# Patient Record
Sex: Female | Born: 1985 | Race: White | Hispanic: No | Marital: Single | Smoking: Never smoker
Health system: Southern US, Community
[De-identification: ages and names within clinical notes are randomized; demographics above are authoritative.]

## PROBLEM LIST (undated history)

## (undated) HISTORY — PX: OTHER SURGICAL HISTORY: SHX169

---

## 2004-05-03 ENCOUNTER — Emergency Department (HOSPITAL_COMMUNITY): Admission: EM | Admit: 2004-05-03 | Discharge: 2004-05-03 | Payer: Self-pay | Admitting: Emergency Medicine
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
## 2006-10-12 ENCOUNTER — Encounter: Admission: RE | Admit: 2006-10-12 | Discharge: 2006-10-12 | Payer: Self-pay | Admitting: Rheumatology

## 2006-10-25 ENCOUNTER — Encounter: Admission: RE | Admit: 2006-10-25 | Discharge: 2006-10-25 | Payer: Self-pay | Admitting: Rheumatology

## 2008-05-04 ENCOUNTER — Emergency Department (HOSPITAL_COMMUNITY): Admission: EM | Admit: 2008-05-04 | Discharge: 2008-05-04 | Payer: Self-pay | Admitting: Emergency Medicine
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
## 2012-10-23 ENCOUNTER — Ambulatory Visit (INDEPENDENT_AMBULATORY_CARE_PROVIDER_SITE_OTHER): Payer: BC Managed Care – PPO | Admitting: Internal Medicine

## 2012-10-23 ENCOUNTER — Ambulatory Visit: Payer: BC Managed Care – PPO

## 2012-10-23 VITALS — BP 128/76 | HR 76 | Temp 98.9°F | Resp 18 | Ht 64.5 in | Wt 115.8 lb

## 2012-10-23 DIAGNOSIS — R634 Abnormal weight loss: Secondary | ICD-10-CM

## 2012-10-23 DIAGNOSIS — R109 Unspecified abdominal pain: Secondary | ICD-10-CM

## 2012-10-23 DIAGNOSIS — IMO0001 Reserved for inherently not codable concepts without codable children: Secondary | ICD-10-CM

## 2012-10-23 DIAGNOSIS — R5383 Other fatigue: Secondary | ICD-10-CM

## 2012-10-23 DIAGNOSIS — R197 Diarrhea, unspecified: Secondary | ICD-10-CM

## 2012-10-23 DIAGNOSIS — R61 Generalized hyperhidrosis: Secondary | ICD-10-CM

## 2012-10-23 DIAGNOSIS — R5381 Other malaise: Secondary | ICD-10-CM

## 2012-10-23 DIAGNOSIS — Z719 Counseling, unspecified: Secondary | ICD-10-CM

## 2012-10-23 LAB — POCT CBC
MCH, POC: 31.6 pg — AB (ref 27–31.2)
MID (cbc): 0.4 (ref 0–0.9)
MPV: 7.8 fL (ref 0–99.8)
POC Granulocyte: 4 (ref 2–6.9)
POC MID %: 5.8 %M (ref 0–12)
Platelet Count, POC: 376 10*3/uL (ref 142–424)
RBC: 3.74 M/uL — AB (ref 4.04–5.48)
WBC: 6.5 10*3/uL (ref 4.6–10.2)

## 2012-10-23 LAB — POCT URINALYSIS DIPSTICK
Bilirubin, UA: NEGATIVE
Leukocytes, UA: NEGATIVE
Nitrite, UA: NEGATIVE
Protein, UA: NEGATIVE
pH, UA: 5.5

## 2012-10-23 LAB — POCT UA - MICROSCOPIC ONLY
Crystals, Ur, HPF, POC: NEGATIVE
Yeast, UA: NEGATIVE

## 2012-10-23 MED ORDER — CILIDINIUM-CHLORDIAZEPOXIDE 2.5-5 MG PO CAPS: 1.0000 | ORAL_CAPSULE | Freq: Three times a day (TID) | ORAL | Status: AC | PRN

## 2012-10-23 NOTE — Progress Notes (Signed)
  Subjective:    Patient ID: Ana Thompson, female    DOB: 1985/05/19, 27 y.o.   MRN: [id number]  HPI    Review of Systems     Objective:   Physical Exam        Assessment & Plan:

## 2012-10-23 NOTE — Patient Instructions (Addendum)
Gluten-Free Diet  Gluten is a protein found in many grains. Gluten is present in wheat, rye, and barley. Gluten from wheat, rye, and barley protein interferes with the absorption of food in people with gluten sensitivity. It may also cause intestinal injury when eaten by individuals with gluten sensitivity.   A sample piece (biopsy) of the small intestine is usually required for a positive diagnosis of gluten sensitivity. Dietary treatment consists of eliminating foods and food ingredients from wheat, rye, and barley. When these are taken out of the diet completely, most people regain function of the small intestine.  Strict compliance is important even during symptom-free periods. People with gluten sensitivity need to be on a gluten-free diet for a lifetime. During the first stages of treatment, some people will also need to restrict dairy products that contain lactose, which is a naturally occurring sugar. Lactose is difficult to absorb when the small intestines are damaged (lactose intolerance).   WHO NEEDS THIS DIET  Some people who have certain diseases need to be on a gluten-free diet. These diseases include:  · Celiac disease.  · Nontropical sprue.  · Gluten-sensitive enteropathy.  · Dermatitis herpetiformis.  SPECIAL NOTES  · Read all labels because gluten may have been added as an incidental ingredient. Words to check for on the label include: flour, starch, durum flour, graham flour, phosphated flour, self-rising flour, semolina, farina, modified food starch, cereal, thickening, fillers, emulsifiers, any kind of malt flavoring, and hydrolyzed vegetable protein. A registered dietician can help you identify possible harmful ingredients in the foods you normally eat.  · If you are not sure whether an ingredient contains gluten, check with the manufacturer. Note that some manufacturers may change ingredients without notice. Always read labels.   · Since flour and cereal products are often used in the  preparation of foods, it is important to be aware of the methods of preparation used, as well as the foods themselves. This is especially true when you are dining out.  Starches  · Allowed: Only those prepared from arrowroot, corn, potato, rice, and bean flours. Rice wafers(*), pure cornmeal tortillas, popcorn, some crackers, and chips(*). Hot cereals made from cornmeal. Ask your dietician which specific hot and cold cereals are allowed. White or sweet potatoes, yams, hominy, rice or wild rice, and special gluten-free pasta. Some oriental rice noodles or bean noodles.  · Avoid: All wheat and rye cereals, wheat germ, barley, bran, graham, malt, bulgur, and millet(-). NOTE: Avoid cereals containing malt as a flavoring, such as rice cereal. Regular noodles, spaghetti, macaroni, and most packaged rice mixes(*). All others containing wheat, rye, or barley.  Vegetables  · Allowed: All plain, fresh, frozen, or canned vegetables.  · Avoid: Creamed vegetables(*) and vegetables canned in sauces(*). Any prepared with wheat, rye, or barley.  Fruit  · Allowed: All fresh, frozen, canned, or dried fruits. Fruit juices.  · Avoid: Thickened or prepared fruits and some pie fillings(*).  Meat and Meat Substitutes  · Allowed: Meat, fish, poultry, or eggs prepared without added wheat, rye, or barley. Luncheon meat(*), frankfurters(*), and pure meat. All aged cheese and processed cheese products(*). Cottage cheese(+) and cream cheese(+). Dried beans, dried peas, and lentils.  · Avoid: Any meat or meat alternate containing wheat, rye, barley, or gluten stabilizers. Bread-containing products, such as Swiss steak, croquettes, and meatloaf. Tuna canned in vegetable broth(*); turkey with HVP injected as part of the basting; any cheese product containing oat gum as an ingredient.  Milk  · Allowed: Milk.   Yogurt made with allowed ingredients(*).  · Avoid: Commercial chocolate milk which may have cereal added(*). Malted milk.  Soups and  Combination Foods  · Allowed: Homemade broth and soups made with allowed ingredients; some canned or frozen soups are allowed(*). Combination or prepared foods that do not contain gluten(*). Read labels.  · Avoid: All soups containing wheat, rye, or barley flour. Bouillon and bouillon cubes that contain hydrolyzed vegetable protein (HVP). Combination or prepared foods that contain gluten(*).  Desserts  · Allowed:  Custard, some pudding mixes(*), homemade puddings from cornstarch, rice, and tapioca. Gelatin desserts, ices, and sherbet(*). Cake, cookies, and other desserts prepared with allowed flours. Some commercial ice creams(*). Ask your dietician about specific brands of dessert that are allowed.  · Avoid: Cakes, cookies, doughnuts, and pastries that are prepared with wheat, rye, or barley flour. Some commercial ice creams(*), ice cream flavors which contain cookies, crumbs, or cheesecake(*). Ice cream cones. All commercially prepared mixes for cakes, cookies, and other desserts(*). Bread pudding and other puddings thickened with flour.  Sweets  · Allowed: Sugar, honey, syrup(*), molasses, jelly, jam, plain hard candy, marshmallows, gumdrops, homemade candies free from wheat, rye, or barley. Coconut.  · Avoid: Commercial candies containing wheat, rye, or barley(*). Certain buttercrunch toffees are dusted with wheat flour. Ask your dietician about specific brands that are not allowed. Chocolate-coated nuts, which are often rolled in flour.  Fats and Oils  · Allowed: Butter, margarine, vegetable oil, sour cream(+), whipping cream, shortening, lard, cream, mayonnaise(*). Some commercial salad dressings(*). Peanut butter.  · Avoid: Some commercial salad dressings(*).  Beverages  · Allowed: Coffee (regular or decaffeinated), tea, herbal tea (read label to be sure that no wheat flour has been added). Carbonated beverages and some root beers(*). Wine, sake, and distilled spirits, such as gin, vodka, and  whiskey.  · Avoid:  Certain cereal beverages. Ask your dietician about specific brands that are not allowed. Beer (unless gluten-free), ale, malted milk, and some root beers, wine, and sake.  Condiments/ Miscellaneous  · Allowed: Salt, pepper, herbs, spices, extracts, and food colorings. Monosodium glutamate (MSG). Cider, rice, and wine vinegar. Baking soda and baking powder. Certain soy sauces. Ask your dietician about specific brands that are allowed. Nuts, coconut, chocolate, and pure cocoa powder.  · Avoid: Some curry powder(*), some dry seasoning mixes(*), some gravy extracts(*), some meat sauces(*), some catsup(*), some prepared mustard(*), horseradish(*), some soy sauce(*), chip dips(*), and some chewing gum(*). Yeast extract (contains barley). Caramel color (may contain malt). Ask your dietician about specific brands of condiments to avoid.  Flour and Thickening Agents  · Allowed: Arrowroot starch (A); Corn bran (B); Corn flour (B,C,D); Corn germ (B); Cornmeal (B,C,D); Corn starch (A); Potato flour (B,C,E); Potato starch flour (B,C,E); Rice bran (B); Rice flours: Plain, brown (B,C,D,E), and Sweet (A,B,C,F). Rice polish (B,C,G); Soy flour (B,C,G); Tapioca starch (A).  The flour and thickening agents described above are good for:  (A) Good thickening agent  (B) Good when combined with other flours  (C) Best combined with milk and eggs in baked products  (D) Best in grainy-textured products  (E) Produces drier product than other flours  (F) Produces moister product than other flours  (G) Adds distinct flavor to product. Use in moderation.  (*) Check labels and investigate any questionable ingredients.   (-) Additional research is needed before this product can be recommended.  (+) Check vegetable gum used.  SAMPLE MEAL PLAN  Breakfast   · Orange juice.  · Banana.  ·   Gluten-free bread.  Custard.  Heart-healthy margarine.  Coffee or tea. These meal plans are provided as samples. Your daily meal plans will vary. Document Released: 04/04/2005 Document Revised: 10/04/2011 Document Reviewed: 05/15/2011 [facility] Patient Information 2014 [place], [place]. Irritable Bowel Syndrome Irritable Bowel Syndrome (IBS) is caused by a disturbance of normal bowel function. Other terms used are spastic colon, mucous colitis, and irritable colon. It does not require surgery, nor does it lead to cancer. There is no cure for IBS. But with proper diet, stress reduction, and medication, you will find that your problems (symptoms) will gradually disappear or improve. IBS is a common digestive disorder. It usually appears in late adolescence or early adulthood. Women develop it twice as often as men. CAUSES  After food has been digested and absorbed in the small intestine, waste material is moved into the colon (large intestine). In the colon, water and salts are absorbed from the undigested products coming from the small intestine. The remaining residue, or fecal material, is held for elimination. Under normal circumstances, gentle, rhythmic contractions on the bowel walls push the fecal material along the colon towards the rectum. In IBS, however, these contractions are irregular and poorly coordinated. The fecal material is either retained too long, resulting in constipation, or expelled too soon, producing diarrhea. SYMPTOMS  The most common symptom of IBS is pain. It is typically in the lower left side of the belly (abdomen). But it may occur anywhere in the abdomen. It can be felt as heartburn, backache, or even as a dull pain in the arms or  shoulders. The pain comes from excessive bowel-muscle spasms and from the buildup of gas and fecal material in the colon. This pain:  Can range from sharp belly (abdominal) cramps to a dull, continuous ache.  Usually worsens soon after eating.  Is typically relieved by having a bowel movement or passing gas. Abdominal pain is usually accompanied by constipation. But it may also produce diarrhea. The diarrhea typically occurs right after a meal or upon arising in the morning. The stools are typically soft and watery. They are often flecked with secretions (mucus). Other symptoms of IBS include:  Bloating.  Loss of appetite.  Heartburn.  Feeling sick to your stomach (nausea).  Belching  Vomiting  Gas. IBS may also cause a number of symptoms that are unrelated to the digestive system:  Fatigue.  Headaches.  Anxiety  Shortness of breath  Difficulty in concentrating.  Dizziness. These symptoms tend to come and go. DIAGNOSIS  The symptoms of IBS closely mimic the symptoms of other, more serious digestive disorders. So your caregiver may wish to perform a variety of additional tests to exclude these disorders. He/she wants to be certain of learning what is wrong (diagnosis). The nature and purpose of each test will be explained to you. TREATMENT A number of medications are available to help correct bowel function and/or relieve bowel spasms and abdominal pain. Among the drugs available are:  Mild, non-irritating laxatives for severe constipation and to help restore normal bowel habits.  Specific anti-diarrheal medications to treat severe or prolonged diarrhea.  Anti-spasmodic agents to relieve intestinal cramps.  Your caregiver may also decide to treat you with a mild tranquilizer or sedative during unusually stressful periods in your life. The important thing to remember is that if any drug is prescribed for you, make sure that you take it exactly as directed. Make sure  that your caregiver knows how well it worked for you. HOME CARE INSTRUCTIONS  Avoid foods that are high in fat or oils. Some examples [id number] cream, butter, frankfurters, sausage, and other fatty meats.  Avoid foods that have a laxative effect, such as fruit, fruit juice, and dairy products.  Cut out carbonated drinks, chewing gum, and "gassy" foods, such as beans and cabbage. This may help relieve bloating and belching.  Bran taken with plenty of liquids may help relieve constipation.  Keep track of what foods seem to trigger your symptoms.  Avoid emotionally charged situations or circumstances that produce anxiety.  Start or continue exercising.  Get plenty of rest and sleep. MAKE SURE YOU:   Understand these instructions.  Will watch your condition.  Will get help right away if you are not doing well or get worse. Document Released: 04/04/2005 Document Revised: 06/27/2011 Document Reviewed: 11/23/2007 [facility] Patient Information 2014 [place], [place]. Lactose Intolerance, Adult Lactose intolerance is when the body is not able to digest lactose, a sugar found in milk and milk products. Lactose intolerance is caused by your body not producing enough of the enzyme lactase. When there is not enough lactase to digest the amount of lactose consumed, discomfort may be felt. Lactose intolerance is not a milk allergy. For most people, lactase deficiency is a condition that develops naturally over time. After about the age of 2, the body begins to produce less lactase. But many people may not experience symptoms until they are much older. CAUSES Things that can cause you to be lactose intolerant include:  Aging.  Being born without the ability to make lactase.  Certain digestive diseases.  Injuries to the small intestine. SYMPTOMS   Feeling sick to your stomach (nauseous).  Diarrhea.  Cramps.  Bloating.  Gas. Symptoms usually show up a half hour or 2 hours after eating  or drinking products containing lactose. TREATMENT  No treatment can improve the body's ability to produce lactase. However, symptoms can be controlled through diet. A medicine may be given to you to take when you consume lactose-containing foods or drinks. The medicine contains the lactase enzyme, which help the body digest lactose better. HOME CARE INSTRUCTIONS  Eat or drink dairy products as told by your caregiver or dietician.  Take all medicine as directed by your caregiver.  Find lactose-free or lactose-reduced products at your local grocery store.  Talk to your caregiver or dietician to decide if you need any dietary supplements. The following is the amount of calcium needed from the diet:  19 to 50 years: 1000 mg  Over 50 years: 1200 mg Calcium and Lactose in Common Foods Non-Dairy Products / Calcium Content (mg)  Calcium-fortified orange juice, 1 cup / 308 to 344 mg  Sardines, with edible bones, 3 oz / 270 mg  Salmon, canned, with edible bones, 3 oz / 205 mg  Soymilk, fortified, 1 cup / 200 mg  Broccoli (raw), 1 cup / 90 mg  Orange, 1 medium / 50 mg  Pinto beans,  cup / 40 mg  Tuna, canned, 3 oz / 10 mg  Lettuce greens,  cup / 10 mg Dairy Products / Calcium Content (mg) / Lactose Content (g)  Yogurt, plain, low-fat, 1 cup / 415 mg / 5 g  Milk, reduced fat, 1 cup / 295 mg / 11 g  Swiss cheese, 1 oz / 270 mg / 1 g  Ice cream,  cup / 85 mg / 6 g  Cottage cheese,  cup / 75 mg / 2 to 3 g SEEK MEDICAL CARE IF: You have  no relief from your symptoms. Document Released: 04/04/2005 Document Revised: 06/27/2011 Document Reviewed: 07/02/2010 [facility] Patient Information 2014 [place], [place]. Constipation, Adult Constipation is when a person has fewer than 3 bowel movements a week; has difficulty having a bowel movement; or has stools that are dry, hard, or larger than normal. As people grow older, constipation is more common. If you try to fix constipation with  medicines that make you have a bowel movement (laxatives), the problem may get worse. Long-term laxative use may cause the muscles of the colon to become weak. A low-fiber diet, not taking in enough fluids, and taking certain medicines may make constipation worse. CAUSES   Certain medicines, such as antidepressants, pain medicine, iron supplements, antacids, and water pills.   Certain diseases, such as diabetes, irritable bowel syndrome (IBS), thyroid disease, or depression.   Not drinking enough water.   Not eating enough fiber-rich foods.   Stress or travel.  Lack of physical activity or exercise.  Not going to the restroom when there is the urge to have a bowel movement.  Ignoring the urge to have a bowel movement.  Using laxatives too much. SYMPTOMS   Having fewer than 3 bowel movements a week.   Straining to have a bowel movement.   Having hard, dry, or larger than normal stools.   Feeling full or bloated.   Pain in the lower abdomen.  Not feeling relief after having a bowel movement. DIAGNOSIS  Your caregiver will take a medical history and perform a physical exam. Further testing may be done for severe constipation. Some tests may include:   A barium enema X-ray to examine your rectum, colon, and sometimes, your small intestine.  A sigmoidoscopy to examine your lower colon.  A colonoscopy to examine your entire colon. TREATMENT  Treatment will depend on the severity of your constipation and what is causing it. Some dietary treatments include drinking more fluids and eating more fiber-rich foods. Lifestyle treatments may include regular exercise. If these diet and lifestyle recommendations do not help, your caregiver may recommend taking over-the-counter laxative medicines to help you have bowel movements. Prescription medicines may be prescribed if over-the-counter medicines do not work.  HOME CARE INSTRUCTIONS   Increase dietary fiber in your diet, such as  fruits, vegetables, whole grains, and beans. Limit high-fat and processed sugars in your diet, such as [place] fries, hamburgers, cookies, candies, and soda.   A fiber supplement may be added to your diet if you cannot get enough fiber from foods.   Drink enough fluids to keep your urine clear or pale yellow.   Exercise regularly or as directed by your caregiver.   Go to the restroom when you have the urge to go. Do not hold it.  Only take medicines as directed by your caregiver. Do not take other medicines for constipation without talking to your caregiver first. SEEK IMMEDIATE MEDICAL CARE IF:   You have bright red blood in your stool.   Your constipation lasts for more than 4 days or gets worse.   You have abdominal or rectal pain.   You have thin, pencil-like stools.  You have unexplained weight loss. MAKE SURE YOU:   Understand these instructions.  Will watch your condition.  Will get help right away if you are not doing well or get worse. Document Released: 01/01/2004 Document Revised: 06/27/2011 Document Reviewed: 03/08/2011 [facility] Patient Information 2014 [place], [place]. Diarrhea Diarrhea is frequent loose and watery bowel movements. It can cause you to feel  weak and dehydrated. Dehydration can cause you to become tired and thirsty, have a dry mouth, and have decreased urination that often is dark yellow. Diarrhea is a sign of another problem, most often an infection that will not last long. In most cases, diarrhea typically lasts 2 3 days. However, it can last longer if it is a sign of something more serious. It is important to treat your diarrhea as directed by your caregive to lessen or prevent future episodes of diarrhea. CAUSES  Some common causes include:  Gastrointestinal infections caused by viruses, bacteria, or parasites.  Food poisoning or food allergies.  Certain medicines, such as antibiotics, chemotherapy, and laxatives.  Artificial sweeteners  and fructose.  Digestive disorders. HOME CARE INSTRUCTIONS  Ensure adequate fluid intake (hydration): have 1 cup (8 oz) of fluid for each diarrhea episode. Avoid fluids that contain simple sugars or sports drinks, fruit juices, whole milk products, and sodas. Your urine should be clear or pale yellow if you are drinking enough fluids. Hydrate with an oral rehydration solution that you can purchase at pharmacies, retail stores, and online. You can prepare an oral rehydration solution at home by mixing the following ingredients together:    tsp table salt.   tsp baking soda.   tsp salt substitute containing potassium chloride.  1  tablespoons sugar.  1 L (34 oz) of water.  Certain foods and beverages may increase the speed at which food moves through the gastrointestinal (GI) tract. These foods and beverages should be avoided and include:  Caffeinated and alcoholic beverages.  High-fiber foods, such as raw fruits and vegetables, nuts, seeds, and whole grain breads and cereals.  Foods and beverages sweetened with sugar alcohols, such as xylitol, sorbitol, and mannitol.  Some foods may be well tolerated and may help thicken stool including:  Starchy foods, such as rice, toast, pasta, low-sugar cereal, oatmeal, grits, baked potatoes, crackers, and bagels.  Bananas.  Applesauce.  Add probiotic-rich foods to help increase healthy bacteria in the GI tract, such as yogurt and fermented milk products.  Wash your hands well after each diarrhea episode.  Only take over-the-counter or prescription medicines as directed by your caregiver.  Take a warm bath to relieve any burning or pain from frequent diarrhea episodes. SEEK IMMEDIATE MEDICAL CARE IF:   You are unable to keep fluids down.  You have persistent vomiting.  You have blood in your stool, or your stools are black and tarry.  You do not urinate in 6 8 hours, or there is only a small amount of very dark urine.  You have  abdominal pain that increases or localizes.  You have weakness, dizziness, confusion, or lightheadedness.  You have a severe headache.  Your diarrhea gets worse or does not get better.  You have a fever or persistent symptoms for more than 2 3 days.  You have a fever and your symptoms suddenly get worse. MAKE SURE YOU:   Understand these instructions.  Will watch your condition.  Will get help right away if you are not doing well or get worse. Document Released: 03/25/2002 Document Revised: 03/21/2012 Document Reviewed: 12/11/2011 [facility] Patient Information 2014 [place], [place].

## 2012-10-23 NOTE — Progress Notes (Signed)
Subjective:    Patient ID: Ana Thompson, female    DOB: 1985-05-22, 27 y.o.   MRN: [id number]  HPI Three weeks ago pt experienced constipation and took Senna and Miralax which was effective but caused diarrhea. Since this time pt has continued to have diarrhea and gas causing discomfort and stomach to rumble. Some burping and some flatulence but mostly feels that the gas is trapped. Pt also experiencing nausea and chills/cold sweats when she has cramping w/diarrhea or stomach pains in between episodes of diarrhea. No vomiting. No burning. No hx of stomach problems. Family hx of spastic colon and IBS (mother). Pt still has appetite, but unable to eat normally d/t increased gas and stomach pain w/eating. Pt has been experiencing worsened sharp stomach pains today. Pt reports HAs during the same time period. Pt experiencing some weakness, esp after diarrhea. Pt has not been drinking enough d/t liquids also causing gas. No pain or problems w/urination. No back pain. Reg menstrual period. No chance of pregnancy. No trouble breathing or cough. 10 lbs of unintentional weight loss over last 6 mos. Good appetite. Under a lot of stress.   Review of Systems fhx ibs    Objective:   Physical Exam  Vitals reviewed. Constitutional: She is oriented to person, place, and time. She appears well-developed and well-nourished. No distress.  HENT:  Mouth/Throat: Oropharynx is clear and moist.  Eyes: EOM are normal. No scleral icterus.  Neck: Normal range of motion. Neck supple. No thyromegaly present.  Cardiovascular: Normal rate, regular rhythm, normal heart sounds and intact distal pulses.   Abdominal: Soft. Bowel sounds are normal. She exhibits no distension and no mass. There is no rebound and no guarding.  Musculoskeletal: Normal range of motion.  Neurological: She is alert and oriented to person, place, and time. No cranial nerve deficit. She exhibits normal muscle tone. Coordination normal.  Skin: No rash  noted.  Psychiatric: She has a normal mood and affect. Her behavior is normal. Judgment and thought content normal.   [facility] reading (PRIMARY) by  Dr Perrin Maltese normal AAS Results for orders placed in visit on 10/23/12  POCT CBC      Result Value Range   WBC 6.5  4.6 - 10.2 K/uL   Lymph, poc 2.1  0.6 - 3.4   POC LYMPH PERCENT 32.9  10 - 50 %L   MID (cbc) 0.4  0 - 0.9   POC MID % 5.8  0 - 12 %M   POC Granulocyte 4.0  2 - 6.9   Granulocyte percent 61.3  37 - 80 %G   RBC 3.74 (*) 4.04 - 5.48 M/uL   Hemoglobin 11.8 (*) 12.2 - 16.2 g/dL   HCT, POC 78.2 (*) 95.6 - 47.9 %   MCV 99.0 (*) 80 - 97 fL   MCH, POC 31.6 (*) 27 - 31.2 pg   MCHC 31.9  31.8 - 35.4 g/dL   RDW, POC 21.3     Platelet Count, POC 376  142 - 424 K/uL   MPV 7.8  0 - 99.8 fL  POCT URINE PREGNANCY      Result Value Range   Preg Test, Ur Negative    POCT UA - MICROSCOPIC ONLY      Result Value Range   WBC, Ur, HPF, POC 0-1     RBC, urine, microscopic 0-1     Bacteria, U Microscopic trace     Mucus, UA neg     Epithelial cells, urine per micros 0-2  Crystals, Ur, HPF, POC neg     Casts, Ur, LPF, POC neg     Yeast, UA neg    POCT URINALYSIS DIPSTICK      Result Value Range   Color, UA yellow     Clarity, UA clear     Glucose, UA neg     Bilirubin, UA neg     Ketones, UA neg     Spec Grav, UA 1.015     Blood, UA neg     pH, UA 5.5     Protein, UA neg     Urobilinogen, UA 0.2     Nitrite, UA neg     Leukocytes, UA Negative             Assessment & Plan:  Weight loss/Abdominal pain/Possible constipation Stress/Trial librax and gluten free, lactose free diet F/up 2-4 weeks

## 2012-10-24 LAB — COMPREHENSIVE METABOLIC PANEL
ALT: 16 U/L (ref 0–35)
AST: 19 U/L (ref 0–37)
Creat: 0.71 mg/dL (ref 0.50–1.10)
Total Bilirubin: 0.4 mg/dL (ref 0.3–1.2)

## 2012-10-26 ENCOUNTER — Encounter: Payer: Self-pay | Admitting: Radiology

## 2012-11-20 ENCOUNTER — Other Ambulatory Visit: Payer: Self-pay | Admitting: Gastroenterology

## 2012-11-20 DIAGNOSIS — R634 Abnormal weight loss: Secondary | ICD-10-CM

## 2012-11-20 DIAGNOSIS — R109 Unspecified abdominal pain: Secondary | ICD-10-CM

## 2012-11-21 ENCOUNTER — Ambulatory Visit
Admission: RE | Admit: 2012-11-21 | Discharge: 2012-11-21 | Disposition: A | Discharge status: Home / Self Care | Payer: BC Managed Care – PPO | Source: Ambulatory Visit | Attending: Gastroenterology | Admitting: Gastroenterology

## 2012-11-21 DIAGNOSIS — R634 Abnormal weight loss: Secondary | ICD-10-CM

## 2012-11-21 DIAGNOSIS — R109 Unspecified abdominal pain: Secondary | ICD-10-CM

## 2013-09-06 ENCOUNTER — Other Ambulatory Visit: Payer: Self-pay | Admitting: Orthopedic Surgery

## 2013-09-06 DIAGNOSIS — M461 Sacroiliitis, not elsewhere classified: Secondary | ICD-10-CM

## 2013-09-14 ENCOUNTER — Other Ambulatory Visit: Payer: BC Managed Care – PPO

## 2014-08-06 ENCOUNTER — Other Ambulatory Visit: Payer: Self-pay | Admitting: Family Medicine

## 2014-08-06 DIAGNOSIS — E042 Nontoxic multinodular goiter: Secondary | ICD-10-CM

## 2014-08-14 ENCOUNTER — Other Ambulatory Visit: Payer: Self-pay

## 2014-08-28 IMAGING — US US ABDOMEN COMPLETE
1 series · 14 of 25 positions shown · non-contrast
Comparison: None.

CLINICAL DATA: Abdominal pain and weight loss.

COMPLETE ABDOMINAL ULTRASOUND

[Series 1: us abdomen complete · 0.20mm/px · 14 of 87 slices shown]
[im 1/87]
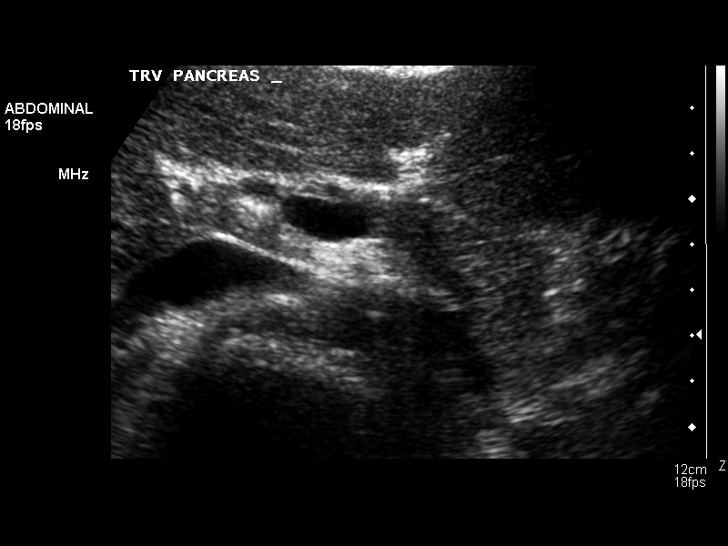
[im 8/87]
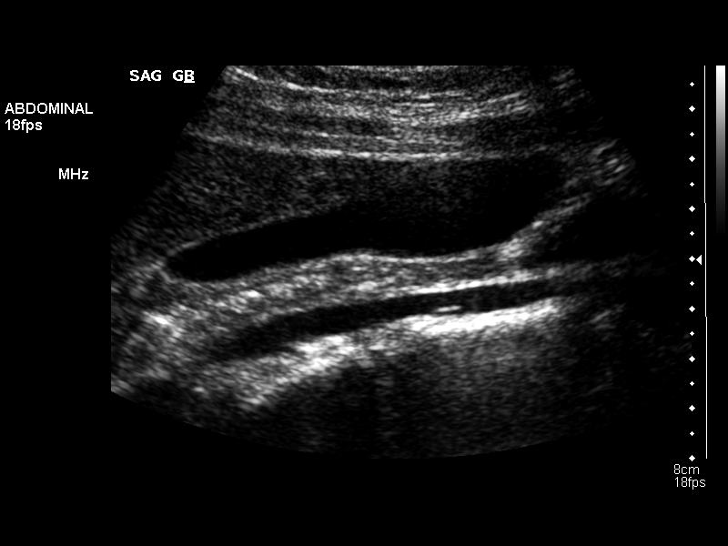
[im 15/87]
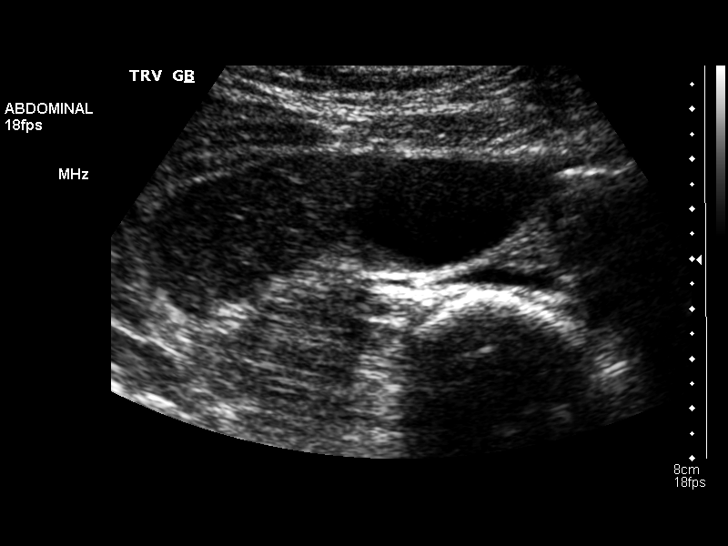
[im 22/87]
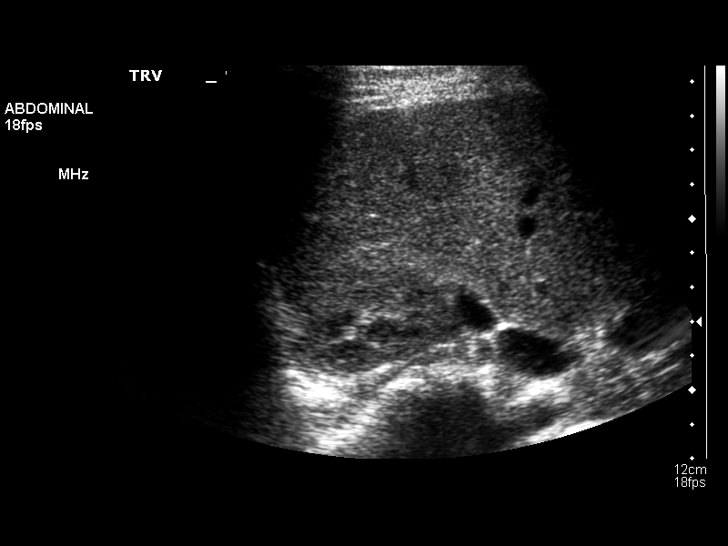
[im 29/87]
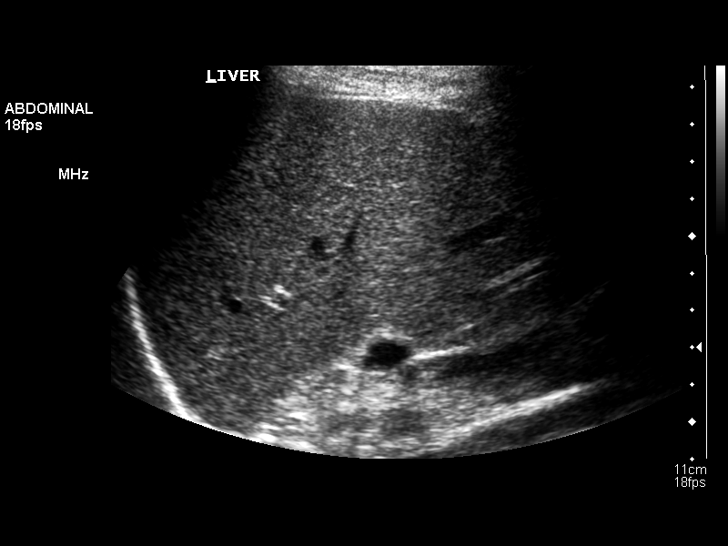
[im 33/87]
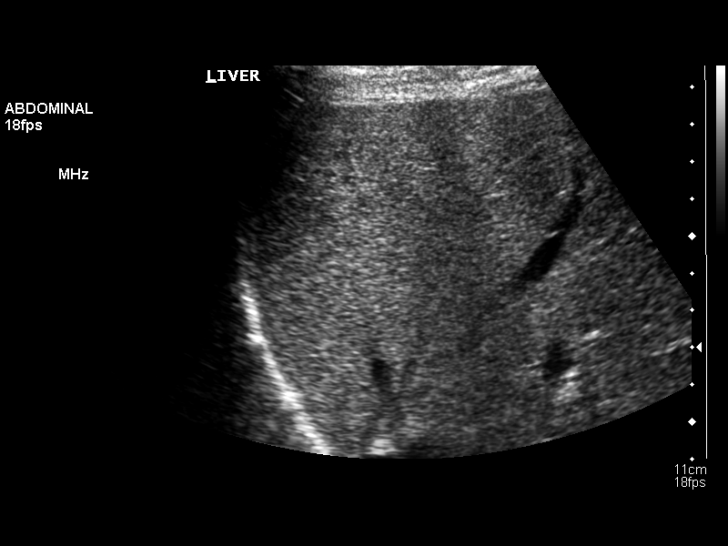
[im 40/87]
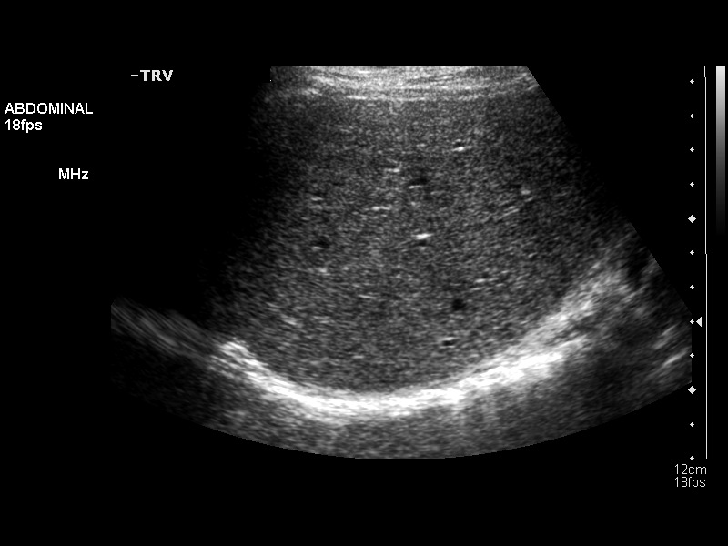
[im 47/87]
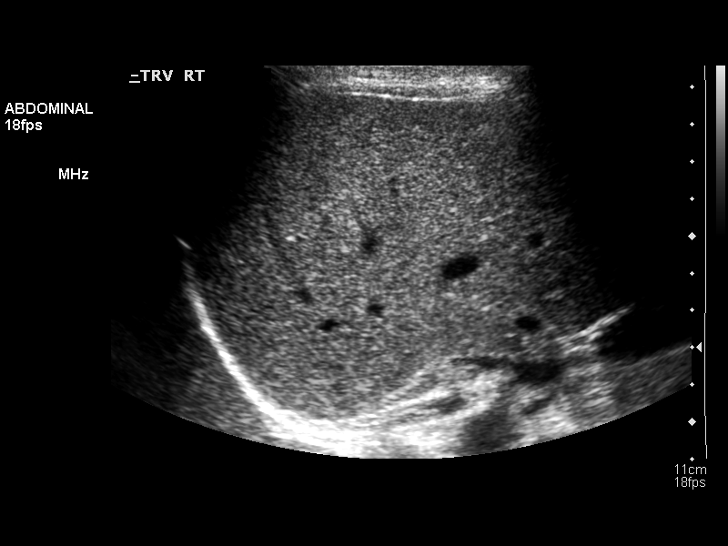
[im 54/87]
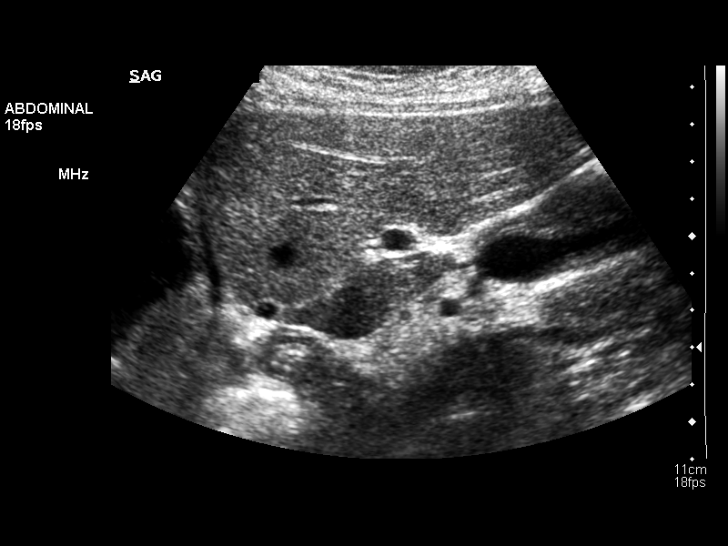
[im 58/87]
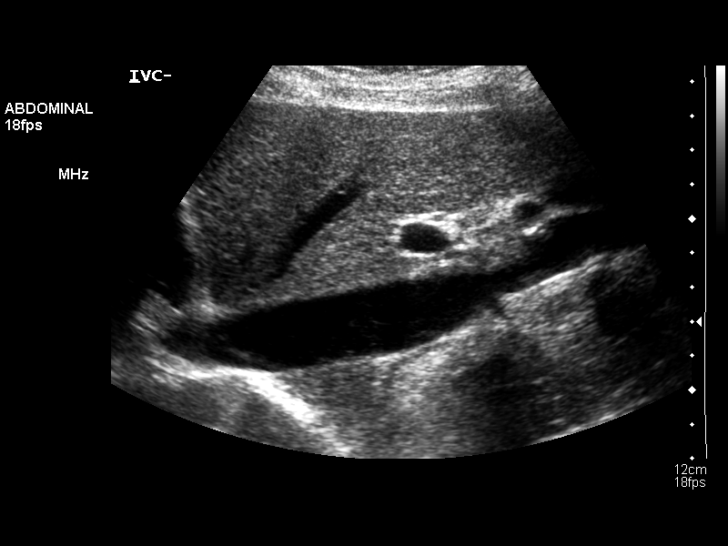
[im 65/87]
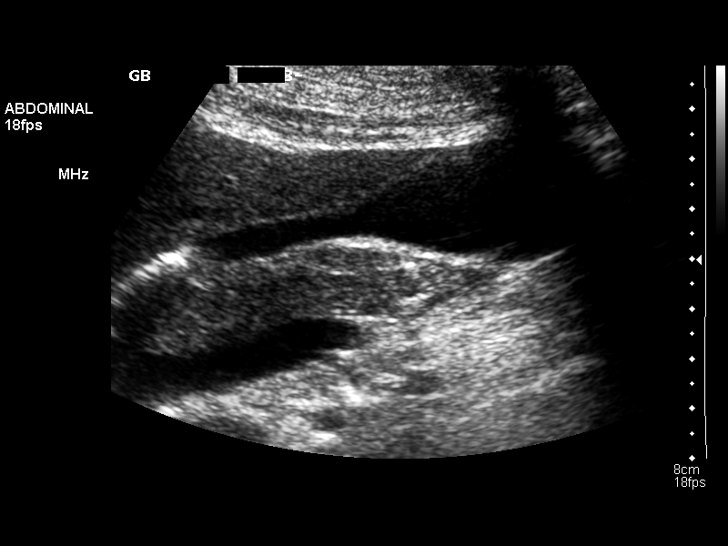
[im 72/87]
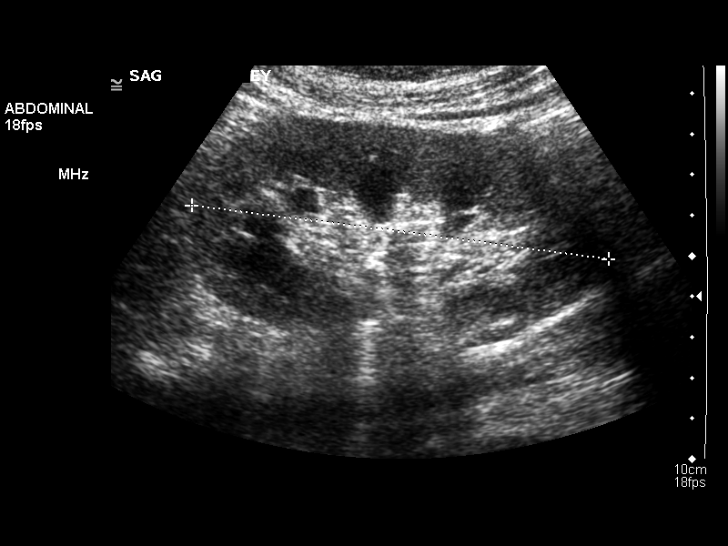
[im 79/87]
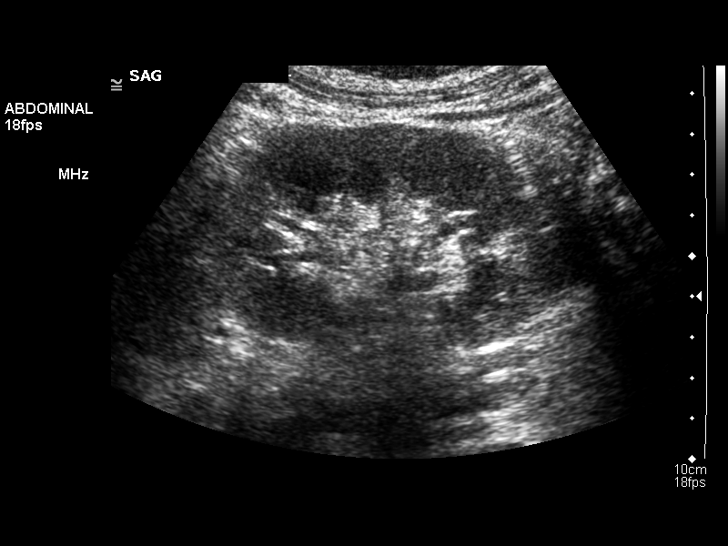
[im 87/87]
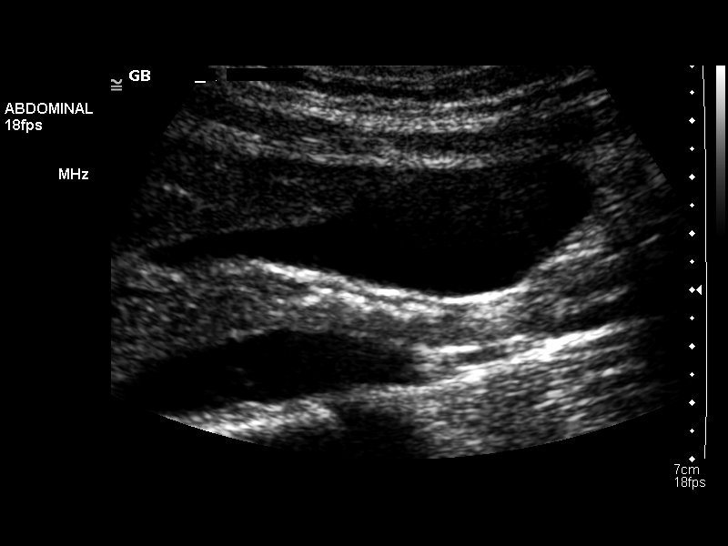

[14 of 25 positions shown; findings below may reference images not displayed]

FINDINGS: Gallbladder:  No gallstones, gallbladder wall thickening, or
pericholecystic fluid.

Common bile duct:  Normal in caliber measuring a maximum of 2.0mm.

Liver:  The liver is sonographically unremarkable.  There is normal
echogenicity without focal lesions or intrahepatic biliary
dilatation.

IVC:  Normal caliber.

Pancreas:  Sonographically unremarkable.

Spleen:  Normal size and echogenicity without focal lesions.

Right Kidney:  11.0 cm in length. Normal renal cortical thickness
and echogenicity without focal lesions or hydronephrosis.

Left Kidney:  10.3 cm in length. Normal renal cortical thickness
and echogenicity without focal lesions or hydronephrosis.

Abdominal aorta:  Normal caliber.
IMPRESSION: Normal abdominal ultrasound examination.
